# Patient Record
Sex: Male | Born: 1985 | Race: Black or African American | Hispanic: No | Marital: Single | Smoking: Current some day smoker
Health system: Southern US, Community
[De-identification: ages and names within clinical notes are randomized; demographics above are authoritative.]

## PROBLEM LIST (undated history)

## (undated) HISTORY — PX: FOOT SURGERY: SHX648

---

## 2014-03-27 ENCOUNTER — Encounter (HOSPITAL_BASED_OUTPATIENT_CLINIC_OR_DEPARTMENT_OTHER): Payer: Self-pay | Admitting: Emergency Medicine

## 2014-03-27 ENCOUNTER — Emergency Department (HOSPITAL_BASED_OUTPATIENT_CLINIC_OR_DEPARTMENT_OTHER)
Admission: EM | Admit: 2014-03-27 | Discharge: 2014-03-27 | Disposition: A | Discharge status: Home / Self Care | Payer: Self-pay | Attending: Emergency Medicine | Admitting: Emergency Medicine

## 2014-03-27 DIAGNOSIS — Z720 Tobacco use: Secondary | ICD-10-CM | POA: Insufficient documentation

## 2014-03-27 DIAGNOSIS — K029 Dental caries, unspecified: Secondary | ICD-10-CM | POA: Insufficient documentation

## 2014-03-27 DIAGNOSIS — K006 Disturbances in tooth eruption: Secondary | ICD-10-CM | POA: Insufficient documentation

## 2014-03-27 MED ORDER — OXYCODONE-ACETAMINOPHEN 5-325 MG PO TABS
1.0000 | ORAL_TABLET | Freq: Once | ORAL | Status: AC
  Administered 2014-03-27: 1 via ORAL
  Filled 2014-03-27: qty 1

## 2014-03-27 MED ORDER — HYDROCODONE-ACETAMINOPHEN 7.5-325 MG/15ML PO SOLN: 10.0000 mL | Freq: Four times a day (QID) | ORAL | Status: AC | PRN

## 2014-03-27 MED ORDER — PENICILLIN V POTASSIUM 500 MG PO TABS: 500.0000 mg | ORAL_TABLET | Freq: Four times a day (QID) | ORAL | Status: AC

## 2014-03-27 MED ORDER — PENICILLIN V POTASSIUM 250 MG PO TABS
500.0000 mg | ORAL_TABLET | Freq: Once | ORAL | Status: AC
  Administered 2014-03-27: 500 mg via ORAL
  Filled 2014-03-27: qty 2

## 2014-03-27 NOTE — ED Provider Notes (Signed)
CSN: [id number]     Arrival date & time 03/27/14  [id number] History   First MD Initiated Contact with Patient 03/27/14 [phone]     Chief Complaint  Patient presents with  . Dental Pain     (Consider location/radiation/quality/duration/timing/severity/associated sxs/prior Treatment) Patient is a 28 y.o. male presenting with tooth pain. The history is provided by the patient.  Dental Pain Location:  Upper and lower Upper teeth location:  2/RU 2nd molar Lower teeth location:  19/LL 1st molar and 31/RL 2nd molar Quality:  Dull Severity:  Severe Onset quality:  Gradual Timing:  Constant Progression:  Worsening Chronicity:  Chronic Context: dental fracture   Context comment:  New fracture this week Relieved by:  Nothing Worsened by:  Nothing tried Ineffective treatments:  None tried Associated symptoms: no drooling, no facial swelling, no fever, no neck swelling and no trismus   Risk factors: smoking     History reviewed. No pertinent past medical history. History reviewed. No pertinent past surgical history. History reviewed. No pertinent family history. History  Substance Use Topics  . Smoking status: Current Some Day Smoker  . Smokeless tobacco: Not on file  . Alcohol Use: Yes     Comment: occ    Review of Systems  Constitutional: Negative for fever.  HENT: Positive for dental problem. Negative for drooling and facial swelling.   All other systems reviewed and are negative.     Allergies  Review of patient's allergies indicates no known allergies.  Home Medications   Prior to Admission medications   Medication Sig Start Date End Date Taking? Authorizing Provider  HYDROcodone-acetaminophen (HYCET) 7.5-325 mg/15 ml solution Take 10 mLs by mouth every 6 (six) hours as needed for moderate pain or severe pain. 03/27/14   Jamise Pentland K Marzetta Lanza-Rasch, MD  penicillin v potassium (VEETID) 500 MG tablet Take 1 tablet (500 mg total) by mouth 4 (four) times daily. 03/27/14 04/03/14   Erielle Gawronski K Travion Ke-Rasch, MD   BP 131/67 mmHg  Pulse 74  Temp(Src) 98.8 F (37.1 C) (Oral)  Resp 20  Ht 6\' 2"  (1.88 m)  Wt 195 lb (88.451 kg)  BMI 25.03 kg/m2  SpO2 98% Physical Exam  Constitutional: He is oriented to person, place, and time. He appears well-developed and well-nourished. No distress.  HENT:  Head: Normocephalic and atraumatic.  Mouth/Throat: Oropharynx is clear and moist. Mucous membranes are not dry. No trismus in the jaw. Abnormal dentition. Dental caries present. No dental abscesses or uvula swelling. No oropharyngeal exudate.  Eyes: Conjunctivae and EOM are normal.  Neck: Normal range of motion. Neck supple. No tracheal deviation present.  Cardiovascular: Normal rate, regular rhythm and intact distal pulses.   Pulmonary/Chest: Effort normal and breath sounds normal. No respiratory distress. He has no wheezes.  Abdominal: Soft. Bowel sounds are normal.  Musculoskeletal: Normal range of motion.  Lymphadenopathy:    He has no cervical adenopathy.  Neurological: He is alert and oriented to person, place, and time.  Skin: Skin is warm and dry.  Psychiatric: He has a normal mood and affect.    ED Course  Procedures (including critical care time) Labs Review Labs Reviewed - No data to display  Imaging Review No results found.   EKG Interpretation None      MDM   Final diagnoses:  Dental caries   Medications  penicillin v potassium (VEETID) tablet 500 mg (500 mg Oral Given 03/27/14 0410)  oxyCODONE-acetaminophen (PERCOCET/ROXICET) 5-325 MG per tablet 1 tablet (1 tablet Oral Given 03/27/14 0410)  Multiple Ellis 3 fractures with necrosis and poor dentition.  Will treat with PCN and close follow up with dentistry.  Referral and resource guide given.      Jasmine AweApril K Valeri Sula-Rasch, MD 03/27/14 [phone]

## 2014-03-27 NOTE — ED Notes (Signed)
History of dental caries with tooth breaking off. Pt does not have a regular dentist.

## 2014-03-27 NOTE — Discharge Instructions (Signed)
Dental Care and Dentist Visits °Dental care supports good overall health. Regular dental visits can also help you avoid dental pain, bleeding, infection, and other more serious health problems in the future. It is important to keep the mouth healthy because diseases in the teeth, gums, and other oral tissues can spread to other areas of the body. Some problems, such as diabetes, heart disease, and pre-term labor have been associated with poor oral health.  °See your dentist every 6 months. If you experience emergency problems such as a toothache or broken tooth, go to the dentist right away. If you see your dentist regularly, you may catch problems early. It is easier to be treated for problems in the early stages.  °WHAT TO EXPECT AT A DENTIST VISIT  °Your dentist will look for many common oral health problems and recommend proper treatment. At your regular dental visit, you can expect: °· Gentle cleaning of the teeth and gums. This includes scraping and polishing. This helps to remove the sticky substance around the teeth and gums (plaque). Plaque forms in the mouth shortly after eating. Over time, plaque hardens on the teeth as tartar. If tartar is not removed regularly, it can cause problems. Cleaning also helps remove stains. °· Periodic X-rays. These pictures of the teeth and supporting bone will help your dentist assess the health of your teeth. °· Periodic fluoride treatments. Fluoride is a natural mineral shown to help strengthen teeth. Fluoride treatment involves applying a fluoride gel or varnish to the teeth. It is most commonly done in children. °· Examination of the mouth, tongue, jaws, teeth, and gums to look for any oral health problems, such as: °¨ Cavities (dental caries). This is decay on the tooth caused by plaque, sugar, and acid in the mouth. It is best to catch a cavity when it is small. °¨ Inflammation of the gums caused by plaque buildup (gingivitis). °¨ Problems with the mouth or malformed  or misaligned teeth. °¨ Oral cancer or other diseases of the soft tissues or jaws.  °KEEP YOUR TEETH AND GUMS HEALTHY °For healthy teeth and gums, follow these general guidelines as well as your dentist's specific advice: °· Have your teeth professionally cleaned at the dentist every 6 months. °· Brush twice daily with a fluoride toothpaste. °· Floss your teeth daily.  °· Ask your dentist if you need fluoride supplements, treatments, or fluoride toothpaste. °· Eat a healthy diet. Reduce foods and drinks with added sugar. °· Avoid smoking. °TREATMENT FOR ORAL HEALTH PROBLEMS °If you have oral health problems, treatment varies depending on the conditions present in your teeth and gums. °· Your caregiver will most likely recommend good oral hygiene at each visit. °· For cavities, gingivitis, or other oral health disease, your caregiver will perform a procedure to treat the problem. This is typically done at a separate appointment. Sometimes your caregiver will refer you to another dental specialist for specific tooth problems or for surgery. °SEEK IMMEDIATE DENTAL CARE IF: °· You have pain, bleeding, or soreness in the gum, tooth, jaw, or mouth area. °· A permanent tooth becomes loose or separated from the gum socket. °· You experience a blow or injury to the mouth or jaw area. °Document Released: 12/01/2010 Document Revised: 06/13/2011 Document Reviewed: 12/01/2010 °ExitCare® Patient Information ©2015 [employer]. This information is not intended to replace advice given to you by your health care provider. Make sure you discuss any questions you have with your health care provider. ° °Emergency Department Resource Guide °1) Find a Doctor   and Pay Out of Pocket °Although you won't have to find out who is covered by your insurance plan, it is a good idea to ask around and get recommendations. You will then need to call the office and see if the doctor you have chosen will accept you as a new patient and what types of  options they offer for patients who are self-pay. Some doctors offer discounts or will set up payment plans for their patients who do not have insurance, but you will need to ask so you aren't surprised when you get to your appointment. ° °2) Contact Your Local Health Department °Not all health departments have doctors that can see patients for sick visits, but many do, so it is worth a call to see if yours does. If you don't know where your local health department is, you can check in your phone book. The CDC also has a tool to help you locate your state's health department, and many state websites also have listings of all of their local health departments. ° °3) Find a Walk-in Clinic °If your illness is not likely to be very severe or complicated, you may want to try a walk in clinic. These are popping up all over the country in pharmacies, drugstores, and shopping centers. They're usually staffed by nurse practitioners or physician assistants that have been trained to treat common illnesses and complaints. They're usually fairly quick and inexpensive. However, if you have serious medical issues or chronic medical problems, these are probably not your best option. ° °No Primary Care Doctor: °- Call [facility] at  832-8000 - they can help you locate a primary care doctor that  accepts your insurance, provides certain services, etc. °- Physician Referral Service- 1-800-533-3463 ° °Chronic Pain Problems: °Organization         Address  Phone   Notes  °[facility]  (336) 297-2271 Patients need to be referred by their primary care doctor.  ° °Medication Assistance: °Organization         Address  Phone   Notes  °[place] [facility] [address] °[place], [place] [address] (336) 641-8030 --Must be a resident of [place] °-- Must have NO insurance coverage whatsoever (no Medicaid/ Medicare, etc.) °-- The pt. MUST have a primary care doctor that directs  their care regularly and follows them in the community °  °[facility]  (866) 331-1348   °[employer]  (888) 892-1162   ° °Agencies that provide inexpensive medical care: °Organization         Address  Phone   Notes  °[facility]  (336) 832-8035   °[facility]    (336) 832-7272   °[facility] [address] °[place], [place] [address] (336) 832-4777   °[facility] [address], °[place] (336) 271-4999   °[facility]    (336) 373-0678   °[facility]    (336) 272-1050   °[facility] ° [address], [place] Phone:  (336) 832-4444, Fax:  (336) 832-4440 Hours of Operation:  9 am - 6 pm, M-F.  Also accepts Medicaid/Medicare and self-pay.  °[facility] ° [address], [place] Phone: (336) 832-3150, Fax: (336) 832-3151. Hours of Operation:  8:30 am - 5:30 pm, M-F.  Also accepts Medicaid and self-pay.  °[facility] [place] [address], [place] Phone: (336) 878-6027   °  [facility] [address], [place], [place] (336)723-1848, Ext. 123 Mondays & Thursdays: 7-9 AM.  First 15 patients are seen on a first come, first serve basis. °  ° °Medicaid-accepting [place] Providers: ° °Organization         Address  Phone   Notes  °[facility] [address], [place] (336) 641-2100 Also accepts self-pay patients.  °[facility] [address], [place] ° (336) 856-9996   °[facility] [address], [place] (336) 288-8857   °[facility] [address] [place] [address], [place] (336) 299-7000   °Veita Bland [address], [place]  ° (336) 373-1557 Only accepts [place] Access Medicaid patients after they have their name applied to their card.  ° °Self-Pay (no insurance) in [place]: ° °Organization          Address  Phone   Notes  °Sickle Cell Patients, [facility] [address], [place] (336) 832-1970   °[facility] [address], [place] (336) 832-4400   °[facility] ° [address], [place] (336) 992-4800   °[facility]/Dr. Osei-Bonsu ° [address] [place] [address], [place] or [address], [place] (336) 841-8500 Phone number for both [place] and [place] locations is the same.  °[facility] [address], [place] (336) 299-0000   °[facility] [address] [place] [address], [place] or [address] (336) 852-7530 °(336) 878-2260   °[facility] [address], [place] (336) 350-1642, phone; (336) 294-5005, fax Sees patients 1st and 3rd Saturday of every month.  Must not qualify for public or private insurance (i.e. Medicaid, Medicare, [place] Health Choice, Veterans' Benefits) • Household income should be no more than 200% of the poverty level •The clinic cannot treat you if you are pregnant or think you are pregnant • Sexually transmitted diseases are not treated at the clinic.  ° ° °Dental Care: °Organization         Address  Phone  Notes  °[facility] [address], [place] (336) 641-6152 Accepts children up to age 21 who are enrolled in Medicaid or [place] Health Choice; pregnant women with a Medicaid card; and children who have applied for Medicaid or [place] Health Choice, but were declined, whose parents can pay a reduced fee at time of service.  °[facility] [place]  [address], [place] (336) 641-7733 Accepts children up to age 21 who are enrolled in Medicaid or [place] Health Choice; pregnant women with a Medicaid card; and children who have applied for Medicaid or [place] Health Choice, but were declined, whose parents can pay a reduced fee at time of  service.  °[facility] ° [address],  (336) 641-4533 Patients are seen by appointment only. Walk-ins are not accepted. [facility] will see patients 18 years of age and older. °Monday - Tuesday (8am-5pm) °Most Wednesdays (8:30-5pm) °$30 per visit, cash only  °[facility] ° [address], [place] (336) 641-4533 Patients are seen by appointment only. Walk-ins are not accepted. [facility] will see patients 18 years of age and older. °One Wednesday Evening (Monthly: Volunteer Based).  $30 per visit,   cash only  °[facility]  (919) 537-3737 for adults; Children under age 4, call Graduate Pediatric Dentistry at (919) [phone]. Children aged 4-14, please call (919) [phone] to request a pediatric application. ° Dental services are provided in all areas of dental care including fillings, crowns and bridges, complete and partial dentures, implants, gum treatment, root canals, and extractions. Preventive care is also provided. Treatment is provided to both adults and children. °Patients are selected via a lottery and there is often a waiting list. °  °[facility] [address], °[place] ° (336) 763-8833 [email or web address] °  °[facility] [address], [place], [place] (336)723-1848, Ext. 123 Second and Fourth Thursday of each month, opens at 6:30 AM; Clinic ends at 9 AM.  Patients are seen on a first-come first-served basis, and a limited number are seen during each clinic.  ° °[facility] ° [address], [place], [place] (336) 723-7904   Eligibility Requirements °You must have lived in [place], [place], or [place] counties for at least the last three months. °  You cannot be eligible for state or federal sponsored healthcare insurance, including Veterans Administration, Medicaid, or Medicare. °  You generally cannot be eligible for healthcare insurance through your employer.   °  How to apply: °Eligibility screenings are held every Tuesday and Wednesday afternoon from 1:00 pm until 4:00 pm. You do not need an appointment for the interview!  °[facility] [address], [place], [place] 336-631-2330   °[facility]  336-342-8273   °[facility]  336-703-3100   °[facility]  336-570-6415   ° °Behavioral Health Resources in the Community: °Intensive Outpatient Programs °Organization         Address  Phone  Notes  °[facility] [address], [place], [place] 336-878-6098   °[facility] [address], [place], [place] 336-832-9800   °[facility] [address], [place], [place] ° 336-882-2125   °[facility] [address],  °[place], [place] 1-800-853-5163 or 336-641-4981   °Substance Abuse Resources °Organization         Address  Phone  Notes  °[facility]  336-882-2125   °[facility]  336-784-9470   °[facility]  336-285-9073   °[facility]  336-845-3988   °Residential & Outpatient Substance Abuse Program  1-800-659-3381   °Psychological Services °Organization         Address  Phone  Notes  °[facility]  336- 832-9600   °[facility]  336- 378-7881   °[facility] [address], [place] 1-800-853-5163 or 336-641-4981   ° °Mobile Crisis Teams °Organization         Address  Phone  Notes  °[facility], Mobile Crisis Care Unit  1-877-626-1772   °Assertive °[facility] ° [address] [place], [place] 336-834-9664   °Sharon DeEsch [address] °[place] 336-554-5454   ° °Self-Help/Support Groups °Organization         Address  Phone             Notes  °[facility] - variety of support groups  336- 373-1402 Call for more information  °Narcotics Anonymous (NA), [facility] [address], °[place]  2 meetings at this location  ° °Residential Treatment Programs °Organization           Address  Phone  Notes  °[facility] [address],    °[place]  1-866-801-8205   °[facility] ° [address], [place], [place] 704-293-8524   °[facility] [address], [place] 336-845-3988 Admissions: 8am-3pm M-F  °[facility] [address]    °[place], [place] 336-841-1104   °[facility] [address], [place], [place] 336-379-7146   °[facility] [address]  °[place], [place] 336-285-9073   °[facility] Intensive Outpatient [address], [place], [place] 336-852-3033   °[facility] [address]  °[place], [place] 1-877-615-2722 or 336-784-9470   °[facility] [address] [place], [place] 336-227-7417 Accepts Medicaid  °[facility] [address]  °[place] 1-800-659-3381 Substance Abuse/Addiction Treatment  ° °[place] Behavioral Health Resources °Organization         Address  Phone  Notes  °[facility]  (888) 581-9988   °Julie Brannon, PhD [address] [place], [place]   (336) 349-5553 or (336) 951-0000   °[facility]   [address] °[place], [place] (336) 349-4454   °[facility] [address], [place], [place] (336) 342-8316 Insurance/Medicaid/sponsorship through [facility]  °[facility] [address]                                    [place], [place] (336) 342-8316 Therapy/tele-psych/case  °[facility] [address]  ° [place], [place] (336) 349-2233    °Dr. Arfeen  (336) 349-4544   °[facility] [place]  [employer] [place] [facility] 1) [address], [place] °2) [address], [place] °3)  [address], [place] (336) 349-3220 °(336) 342-7768 ° °(336) 342-8140   °[place] [facility]  Hotline (336) 342-1394 or (336) 342-3537 (After Hours)    ° °

## 2014-03-30 ENCOUNTER — Emergency Department (HOSPITAL_BASED_OUTPATIENT_CLINIC_OR_DEPARTMENT_OTHER)
Admission: EM | Admit: 2014-03-30 | Discharge: 2014-03-30 | Disposition: A | Discharge status: Home / Self Care | Payer: Self-pay | Attending: Emergency Medicine | Admitting: Emergency Medicine

## 2014-03-30 ENCOUNTER — Encounter (HOSPITAL_BASED_OUTPATIENT_CLINIC_OR_DEPARTMENT_OTHER): Payer: Self-pay | Admitting: *Deleted

## 2014-03-30 DIAGNOSIS — K088 Other specified disorders of teeth and supporting structures: Secondary | ICD-10-CM | POA: Insufficient documentation

## 2014-03-30 DIAGNOSIS — K029 Dental caries, unspecified: Secondary | ICD-10-CM | POA: Insufficient documentation

## 2014-03-30 DIAGNOSIS — K006 Disturbances in tooth eruption: Secondary | ICD-10-CM | POA: Insufficient documentation

## 2014-03-30 DIAGNOSIS — Z720 Tobacco use: Secondary | ICD-10-CM | POA: Insufficient documentation

## 2014-03-30 DIAGNOSIS — K0381 Cracked tooth: Secondary | ICD-10-CM | POA: Insufficient documentation

## 2014-03-30 DIAGNOSIS — K0889 Other specified disorders of teeth and supporting structures: Secondary | ICD-10-CM

## 2014-03-30 DIAGNOSIS — Z792 Long term (current) use of antibiotics: Secondary | ICD-10-CM | POA: Insufficient documentation

## 2014-03-30 MED ORDER — TRAMADOL HCL 50 MG PO TABS
50.0000 mg | ORAL_TABLET | Freq: Once | ORAL | Status: AC
  Administered 2014-03-30: 50 mg via ORAL
  Filled 2014-03-30: qty 1

## 2014-03-30 MED ORDER — ACETAMINOPHEN 325 MG PO TABS
650.0000 mg | ORAL_TABLET | Freq: Once | ORAL | Status: AC
  Administered 2014-03-30: 650 mg via ORAL
  Filled 2014-03-30: qty 2

## 2014-03-30 MED ORDER — TRAMADOL-ACETAMINOPHEN 37.5-325 MG PO TABS: 1.0000 | ORAL_TABLET | Freq: Four times a day (QID) | ORAL | Status: AC | PRN

## 2014-03-30 NOTE — ED Provider Notes (Signed)
CSN: [id number]     Arrival date & time 03/30/14  1725 History   First MD Initiated Contact with Patient 03/30/14 1749     Chief Complaint  Patient presents with  . Dental Pain     (Consider location/radiation/quality/duration/timing/severity/associated sxs/prior Treatment) Patient is a 28 y.o. male presenting with tooth pain. The history is provided by the patient and medical records.  Dental Pain   This is a 28 year old male with no significant past medical history presenting to the ED for dental pain. Seen 4 days ago for the same, prescribed pain medication and antibiotics. He reports no improvement of his symptoms. He states he has an appointment with dentist tomorrow for first evaluation, and an appointment next week to have teeth extracted. He states the pain medication only lasted for 2 days and was not really helping. He states he discontinued the antibiotics. He denies any fever or chills.  History reviewed. No pertinent past medical history. History reviewed. No pertinent past surgical history. No family history on file. History  Substance Use Topics  . Smoking status: Current Some Day Smoker  . Smokeless tobacco: Not on file  . Alcohol Use: Yes     Comment: occ    Review of Systems  HENT: Positive for dental problem.   All other systems reviewed and are negative.     Allergies  Review of patient's allergies indicates no known allergies.  Home Medications   Prior to Admission medications   Medication Sig Start Date End Date Taking? Authorizing Provider  HYDROcodone-acetaminophen (HYCET) 7.5-325 mg/15 ml solution Take 10 mLs by mouth every 6 (six) hours as needed for moderate pain or severe pain. 03/27/14   April K Palumbo-Rasch, MD  penicillin v potassium (VEETID) 500 MG tablet Take 1 tablet (500 mg total) by mouth 4 (four) times daily. 03/27/14 04/03/14  April K Palumbo-Rasch, MD   BP 151/100 mmHg  Pulse 75  Temp(Src) 99.8 F (37.7 C) (Oral)  Resp 18  Ht 6'  2" (1.88 m)  Wt 195 lb (88.451 kg)  BMI 25.03 kg/m2  SpO2 100%   Physical Exam  Constitutional: He is oriented to person, place, and time. He appears well-developed and well-nourished. No distress.  HENT:  Head: Normocephalic and atraumatic.  Mouth/Throat: Uvula is midline, oropharynx is clear and moist and mucous membranes are normal. Abnormal dentition. Dental caries present. No dental abscesses. No oropharyngeal exudate, posterior oropharyngeal edema, posterior oropharyngeal erythema or tonsillar abscesses.    Teeth largely in poor dentition, teeth painful as depicted- all broken with multiple cavities, surrounding gingiva with apparent gingivitis but no appreciable fluid collection or abscess formation, handling secretions appropriately, no trismus  Eyes: Conjunctivae and EOM are normal. Pupils are equal, round, and reactive to light.  Neck: Normal range of motion. Neck supple.  Cardiovascular: Normal rate, regular rhythm and normal heart sounds.   Pulmonary/Chest: Effort normal and breath sounds normal. No respiratory distress. He has no wheezes.  Musculoskeletal: Normal range of motion.  Neurological: He is alert and oriented to person, place, and time.  Skin: Skin is warm and dry. He is not diaphoretic.  Psychiatric: He has a normal mood and affect.  Nursing note and vitals reviewed.   ED Course  Procedures (including critical care time) Labs Review Labs Reviewed - No data to display  Imaging Review No results found.   EKG Interpretation None      MDM   Final diagnoses:  Pain, dental   28 year old male with dental pain. Seen  in the ED for days ago for the same. He has since run out of pain medication, reports no relief. States has appointment with dentist this week. No signs of abscess on exam, patient afebrile and non-toxic. He'll continue antibiotics from prior visit, short supply Ultracet given. Follow-up with dentist as scheduled.  Discussed plan with patient,  he/she acknowledged understanding and agreed with plan of care.  Return precautions given for new or worsening symptoms.  Garlon HatchetLisa M Lilias Lorensen, PA-C 03/30/14 [id number]  Mirian MoMatthew Gentry, MD 04/05/14 [phone]

## 2014-03-30 NOTE — ED Notes (Addendum)
Patient states that he has continuing dental pain. Was seen on Thursday, but states the medications are not helping. States that he has a dentist appt in a week and a half. Told another RN that he had an appt on Monday. Pt was given hycodan, states it only lasted about 2 days and he has a hard time taking liquid medicine.

## 2014-03-30 NOTE — Discharge Instructions (Signed)
Take the prescribed medication as directed. Follow-up with dentist. Return to the ED for new or worsening symptoms.

## 2015-11-29 ENCOUNTER — Emergency Department (HOSPITAL_BASED_OUTPATIENT_CLINIC_OR_DEPARTMENT_OTHER): Payer: Self-pay

## 2015-11-29 ENCOUNTER — Emergency Department (HOSPITAL_BASED_OUTPATIENT_CLINIC_OR_DEPARTMENT_OTHER)
Admission: EM | Admit: 2015-11-29 | Discharge: 2015-11-29 | Disposition: A | Discharge status: Home / Self Care | Payer: Self-pay | Attending: Emergency Medicine | Admitting: Emergency Medicine

## 2015-11-29 ENCOUNTER — Encounter (HOSPITAL_BASED_OUTPATIENT_CLINIC_OR_DEPARTMENT_OTHER): Payer: Self-pay | Admitting: Emergency Medicine

## 2015-11-29 DIAGNOSIS — Y9389 Activity, other specified: Secondary | ICD-10-CM | POA: Insufficient documentation

## 2015-11-29 DIAGNOSIS — S9032XA Contusion of left foot, initial encounter: Secondary | ICD-10-CM

## 2015-11-29 DIAGNOSIS — Y999 Unspecified external cause status: Secondary | ICD-10-CM | POA: Insufficient documentation

## 2015-11-29 DIAGNOSIS — F17200 Nicotine dependence, unspecified, uncomplicated: Secondary | ICD-10-CM | POA: Insufficient documentation

## 2015-11-29 DIAGNOSIS — Y92410 Unspecified street and highway as the place of occurrence of the external cause: Secondary | ICD-10-CM | POA: Insufficient documentation

## 2015-11-29 MED ORDER — IBUPROFEN 200 MG PO TABS
600.0000 mg | ORAL_TABLET | Freq: Once | ORAL | Status: AC
  Administered 2015-11-29: 600 mg via ORAL
  Filled 2015-11-29: qty 1
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
## 2015-11-29 NOTE — ED Triage Notes (Signed)
Patient ambulatory to triage, in no distress - asked to turn off face time with mother during the triage process.
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
## 2017-12-01 ENCOUNTER — Encounter (HOSPITAL_BASED_OUTPATIENT_CLINIC_OR_DEPARTMENT_OTHER): Payer: Self-pay

## 2017-12-01 ENCOUNTER — Emergency Department (HOSPITAL_BASED_OUTPATIENT_CLINIC_OR_DEPARTMENT_OTHER)
Admission: EM | Admit: 2017-12-01 | Discharge: 2017-12-01 | Disposition: A | Discharge status: Home / Self Care | Payer: Self-pay | Attending: Emergency Medicine | Admitting: Emergency Medicine

## 2017-12-01 ENCOUNTER — Other Ambulatory Visit: Payer: Self-pay

## 2017-12-01 DIAGNOSIS — F17200 Nicotine dependence, unspecified, uncomplicated: Secondary | ICD-10-CM | POA: Insufficient documentation

## 2017-12-01 DIAGNOSIS — Z202 Contact with and (suspected) exposure to infections with a predominantly sexual mode of transmission: Secondary | ICD-10-CM | POA: Insufficient documentation

## 2017-12-01 LAB — URINALYSIS, ROUTINE W REFLEX MICROSCOPIC
Bilirubin Urine: NEGATIVE
GLUCOSE, UA: NEGATIVE mg/dL
HGB URINE DIPSTICK: NEGATIVE
KETONES UR: NEGATIVE mg/dL
Leukocytes, UA: NEGATIVE
Nitrite: NEGATIVE
Protein, ur: NEGATIVE mg/dL
Specific Gravity, Urine: 1.025 (ref 1.005–1.030)
pH: 6 (ref 5.0–8.0)

## 2017-12-01 MED ORDER — AZITHROMYCIN 250 MG PO TABS
1000.0000 mg | ORAL_TABLET | Freq: Once | ORAL | Status: AC
  Administered 2017-12-01: 1000 mg via ORAL
  Filled 2017-12-01: qty 4

## 2017-12-01 MED ORDER — LIDOCAINE HCL (PF) 1 % IJ SOLN
INTRAMUSCULAR | Status: AC
  Administered 2017-12-01: 1.2 mL
  Filled 2017-12-01: qty 5

## 2017-12-01 MED ORDER — CEFTRIAXONE SODIUM 250 MG IJ SOLR
250.0000 mg | Freq: Once | INTRAMUSCULAR | Status: AC
  Administered 2017-12-01: 250 mg via INTRAMUSCULAR
  Filled 2017-12-01: qty 250

## 2017-12-01 NOTE — ED Triage Notes (Signed)
Pt reports he came to get tested for an STD that his girlfriend told him she was positive for.

## 2017-12-01 NOTE — ED Provider Notes (Signed)
[facility] EMERGENCY DEPARTMENT Provider Note   CSN: [id number] Arrival date & time: 12/01/17  0840     History   Chief Complaint Chief Complaint  Patient presents with  . Exposure to STD    HPI Derek Bradley is a 32 y.o. male.  HPI Patient presents after an STD exposure.  States his girlfriend tested positive for chlamydia.  He has had unprotected sex with her.  Patient states he really has not had any symptoms but states once she told him he states he feels a little strange after urinates.  States he would not think anything of this if he had not just been told about the chlamydia.  No penile discharge.  No fevers.  No rashes. History reviewed. No pertinent past medical history.  There are no active problems to display for this patient.   Past Surgical History:  Procedure Laterality Date  . FOOT SURGERY          Home Medications    Prior to Admission medications   Medication Sig Start Date End Date Taking? Authorizing Provider  HYDROcodone-acetaminophen (HYCET) 7.5-325 mg/15 ml solution Take 10 mLs by mouth every 6 (six) hours as needed for moderate pain or severe pain. 03/27/14   Palumbo, April, MD  traMADol-acetaminophen (ULTRACET) 37.5-325 MG per tablet Take 1 tablet by mouth every 6 (six) hours as needed. 03/30/14   Garlon Hatchet, PA-C    Family History No family history on file.  Social History Social History   Tobacco Use  . Smoking status: Current Some Day Smoker  . Smokeless tobacco: Never Used  Substance Use Topics  . Alcohol use: Yes    Comment: occ  . Drug use: Not on file     Allergies   Patient has no known allergies.   Review of Systems Review of Systems  Constitutional: Negative for appetite change and fever.  Respiratory: Negative for shortness of breath.   Cardiovascular: Negative for chest pain.  Genitourinary: Negative for discharge, dysuria, penile pain, penile swelling and testicular pain.  Musculoskeletal:  Negative for back pain.  Skin: Negative for rash.  Neurological: Negative for weakness.     Physical Exam Updated Vital Signs BP (!) 163/81 (BP Location: Left Arm)   Pulse 68   Temp 98.2 F (36.8 C) (Oral)   Resp 18   Ht 6\' 2"  (1.88 m)   Wt 86.2 kg   SpO2 100%   BMI 24.39 kg/m   Physical Exam  Constitutional: He appears well-developed.  HENT:  Head: Atraumatic.  Cardiovascular: Normal rate.  Genitourinary: Penis normal. No penile tenderness.  Genitourinary Comments: No penile discharge.  No testicular tenderness.  No skin lesions.  Neurological: He is alert.  Skin: Skin is warm.     ED Treatments / Results  Labs (all labs ordered are listed, but only abnormal results are displayed) Labs Reviewed  RPR  HIV ANTIBODY (ROUTINE TESTING)  URINALYSIS, ROUTINE W REFLEX MICROSCOPIC  GC/CHLAMYDIA PROBE AMP ([place]) NOT AT [facility]    EKG None  Radiology No results found.  Procedures Procedures (including critical care time)  Medications Ordered in ED Medications  cefTRIAXone (ROCEPHIN) injection 250 mg (has no administration in time range)  azithromycin (ZITHROMAX) tablet 1,000 mg (has no administration in time range)     Initial Impression / Assessment and Plan / ED Course  I have reviewed the triage vital signs and the nursing notes.  Pertinent labs & imaging results that were available during my care of  the patient were reviewed by me and considered in my medical decision making (see chart for details).     Patient with STD exposure.  Will empirically treat.  Testing sent.  Discharge home.  Final Clinical Impressions(s) / ED Diagnoses   Final diagnoses:  STD exposure    ED Discharge Orders    None       Benjiman CorePickering, Anneliese Leblond, MD 12/01/17 [phone]

## 2017-12-02 LAB — RPR: RPR Ser Ql: NONREACTIVE

## 2017-12-02 LAB — HIV ANTIBODY (ROUTINE TESTING W REFLEX): HIV Screen 4th Generation wRfx: NONREACTIVE

## 2017-12-05 LAB — GC/CHLAMYDIA PROBE AMP (~~LOC~~) NOT AT ARMC
Chlamydia: POSITIVE — AB
Neisseria Gonorrhea: NEGATIVE

## 2020-06-24 ENCOUNTER — Emergency Department (HOSPITAL_BASED_OUTPATIENT_CLINIC_OR_DEPARTMENT_OTHER)
Admission: EM | Admit: 2020-06-24 | Discharge: 2020-06-25 | Disposition: A | Discharge status: Home / Self Care | Payer: Self-pay | Attending: Emergency Medicine | Admitting: Emergency Medicine

## 2020-06-24 ENCOUNTER — Other Ambulatory Visit: Payer: Self-pay

## 2020-06-24 DIAGNOSIS — R309 Painful micturition, unspecified: Secondary | ICD-10-CM | POA: Insufficient documentation

## 2020-06-24 DIAGNOSIS — R369 Urethral discharge, unspecified: Secondary | ICD-10-CM | POA: Insufficient documentation

## 2020-06-24 DIAGNOSIS — R300 Dysuria: Secondary | ICD-10-CM | POA: Insufficient documentation

## 2020-06-24 DIAGNOSIS — Z202 Contact with and (suspected) exposure to infections with a predominantly sexual mode of transmission: Secondary | ICD-10-CM | POA: Insufficient documentation

## 2020-06-24 DIAGNOSIS — F17200 Nicotine dependence, unspecified, uncomplicated: Secondary | ICD-10-CM | POA: Insufficient documentation

## 2020-06-24 NOTE — ED Triage Notes (Signed)
Per pt had sex with an partner from his past and noticed sensation with urination about 2  weeks ago has discharge and pain now.

## 2020-06-25 ENCOUNTER — Encounter (HOSPITAL_BASED_OUTPATIENT_CLINIC_OR_DEPARTMENT_OTHER): Payer: Self-pay | Admitting: Emergency Medicine

## 2020-06-25 LAB — HIV ANTIBODY (ROUTINE TESTING W REFLEX): HIV Screen 4th Generation wRfx: NONREACTIVE

## 2020-06-25 LAB — RPR: RPR Ser Ql: NONREACTIVE

## 2020-06-25 MED ORDER — DOXYCYCLINE HYCLATE 100 MG PO CAPS: 100.0000 mg | ORAL_CAPSULE | Freq: Two times a day (BID) | ORAL | 0 refills | Status: AC

## 2020-06-25 MED ORDER — DOXYCYCLINE HYCLATE 100 MG PO TABS
100.0000 mg | ORAL_TABLET | Freq: Once | ORAL | Status: AC
  Administered 2020-06-25: 100 mg via ORAL
  Filled 2020-06-25: qty 1

## 2020-06-25 MED ORDER — LIDOCAINE HCL (PF) 1 % IJ SOLN: 2.0000 mL | Freq: Once | INTRAMUSCULAR | Status: AC

## 2020-06-25 MED ORDER — CEFTRIAXONE SODIUM 500 MG IJ SOLR
500.0000 mg | Freq: Once | INTRAMUSCULAR | Status: AC
  Administered 2020-06-25: 500 mg via INTRAMUSCULAR
  Filled 2020-06-25: qty 500

## 2020-06-25 MED ORDER — LIDOCAINE HCL (PF) 1 % IJ SOLN
INTRAMUSCULAR | Status: AC
  Administered 2020-06-25: 2 mL
  Filled 2020-06-25: qty 5

## 2020-06-25 NOTE — Discharge Instructions (Signed)
You have been seen today in the Emergency Department (ED) for pelvic pain and discharge.  You have been treated with a one time dose of antibiotics but will also need to take 1 week of antibiotics.  Please have your partner tested for STD's and do not resume sexual activity until you and your partner have confirmed negative test results or have both been treated.  Please follow up with your doctor as soon as possible regarding today's ED visit and your symptoms.   Return to the ED if your pain worsens, you develop a fever, or for any other symptoms that concern you.

## 2020-06-25 NOTE — ED Provider Notes (Signed)
Emergency Department Provider Note   I have reviewed the triage vital signs and the nursing notes.   HISTORY  Chief Complaint Exposure to STD   HPI Derek Bradley is a 35 y.o. male presents to the emergency department for evaluation of urethral discharge and dysuria.  Patient states he had unprotected sex with a partner and shortly afterwards developed some mild burning with urination.  The symptoms have progressed and is now seeing a cannot discharge in his underwear.  He notes similar symptoms with sexually transmitted infections in the past.  He was last treated in 2019 and had negative RPR and HIV testing at that time. No fever, chills, back pain, or abdominal pain.   History reviewed. No pertinent past medical history.  There are no problems to display for this patient.   Past Surgical History:  Procedure Laterality Date  . FOOT SURGERY      Allergies Patient has no known allergies.  History reviewed. No pertinent family history.  Social History Social History   Tobacco Use  . Smoking status: Current Some Day Smoker  . Smokeless tobacco: Never Used  Substance Use Topics  . Alcohol use: Yes    Comment: occ    Review of Systems  Constitutional: No fever/chills Gastrointestinal: No abdominal pain.  No nausea, no vomiting.  No diarrhea.  No constipation. Genitourinary: Positive dysuria and urethral discharge.  Musculoskeletal: Negative for back pain.   ____________________________________________   PHYSICAL EXAM:  VITAL SIGNS: ED Triage Vitals  Enc Vitals Group     BP 06/25/20 0003 131/77     Pulse Rate 06/25/20 0003 72     Resp 06/25/20 0003 18     Temp 06/25/20 0003 98 F (36.7 C)     Temp Source 06/25/20 0003 Oral     SpO2 06/25/20 0003 98 %     Weight 06/25/20 0000 194 lb (88 kg)     Height 06/25/20 0000 6\' 1"  (1.854 m)   Constitutional: Alert and oriented. Well appearing and in no acute distress. Eyes: Conjunctivae are normal. Head:  Atraumatic. Nose: No congestion/rhinnorhea. Mouth/Throat: Mucous membranes are moist.   Neck: No stridor.   Cardiovascular: Good peripheral circulation.  Respiratory: Normal respiratory effort.  Gastrointestinal: No distention.  Musculoskeletal: No gross deformities of extremities. Neurologic:  Normal speech and language.  Skin:  Skin is warm, dry and intact. No rash noted.  ____________________________________________   LABS (all labs ordered are listed, but only abnormal results are displayed)  Labs Reviewed  RPR  HIV ANTIBODY (ROUTINE TESTING W REFLEX)  GC/CHLAMYDIA PROBE AMP ([place]) NOT AT [facility]   ____________________________________________   PROCEDURES  Procedure(s) performed:   Procedures  None  ____________________________________________   INITIAL IMPRESSION / ASSESSMENT AND PLAN / ED COURSE  Pertinent labs & imaging results that were available during my care of the patient were reviewed by me and considered in my medical decision making (see chart for details).   Patient presents to the emergency department with symptoms consistent with sexually transmitted infection.  Vital signs are unremarkable.  Patient is well-appearing.  Discussed barrier contraception/abstinence to prevent further transmission of sexually transmitted infection prior to completing his antibiotic course.  We discussed the new CDC recommendations regarding treatment of STD including 1 week of doxycycline which was ordered.  Patient last tested for HIV in 2019 and agrees to repeat testing here for HIV and RPR.  He plans to notify his partner that they should also be tested and treated for infection.  Provided contact information for PCP at discharge for further follow up.    ____________________________________________  FINAL CLINICAL IMPRESSION(S) / ED DIAGNOSES  Final diagnoses:  Penile discharge     MEDICATIONS GIVEN DURING THIS VISIT:  Medications  cefTRIAXone (ROCEPHIN)  injection 500 mg (500 mg Intramuscular Given 06/25/20 0039)  doxycycline (VIBRA-TABS) tablet 100 mg (100 mg Oral Given 06/25/20 0038)  lidocaine (PF) (XYLOCAINE) 1 % injection 2 mL (2 mLs Infiltration Given 06/25/20 0050)     NEW OUTPATIENT MEDICATIONS STARTED DURING THIS VISIT:  Discharge Medication List as of 06/25/2020 12:15 AM    START taking these medications   Details  doxycycline (VIBRAMYCIN) 100 MG capsule Take 1 capsule (100 mg total) by mouth 2 (two) times daily for 7 days., Starting Thu 06/25/2020, Until Thu 07/02/2020, Normal        Note:  This document was prepared using Dragon voice recognition software and may include unintentional dictation errors.  Alona Bene, MD, [facility] Emergency Medicine    Brandt Chaney, Arlyss Repress, MD 06/25/20 [phone]

## 2020-06-26 LAB — GC/CHLAMYDIA PROBE AMP (~~LOC~~) NOT AT ARMC
Chlamydia: NEGATIVE
Comment: NEGATIVE
Comment: NORMAL
Neisseria Gonorrhea: NEGATIVE

## 2022-12-22 ENCOUNTER — Encounter (HOSPITAL_BASED_OUTPATIENT_CLINIC_OR_DEPARTMENT_OTHER): Payer: Self-pay | Admitting: Emergency Medicine

## 2022-12-22 ENCOUNTER — Other Ambulatory Visit: Payer: Self-pay

## 2022-12-22 ENCOUNTER — Emergency Department (HOSPITAL_BASED_OUTPATIENT_CLINIC_OR_DEPARTMENT_OTHER)
Admission: EM | Admit: 2022-12-22 | Discharge: 2022-12-22 | Disposition: A | Discharge status: Home / Self Care | Payer: Self-pay | Attending: Emergency Medicine | Admitting: Emergency Medicine

## 2022-12-22 DIAGNOSIS — N4889 Other specified disorders of penis: Secondary | ICD-10-CM | POA: Insufficient documentation

## 2022-12-22 LAB — URINALYSIS, ROUTINE W REFLEX MICROSCOPIC
Bilirubin Urine: NEGATIVE
Glucose, UA: NEGATIVE mg/dL
Hgb urine dipstick: NEGATIVE
Ketones, ur: NEGATIVE mg/dL
Leukocytes,Ua: NEGATIVE
Nitrite: NEGATIVE
Protein, ur: NEGATIVE mg/dL
Specific Gravity, Urine: >=1.03 (ref 1.005–1.030)
pH: 5.5 (ref 5.0–8.0)

## 2022-12-22 MED ORDER — METRONIDAZOLE 500 MG PO TABS
2000.0000 mg | ORAL_TABLET | Freq: Once | ORAL | Status: AC
  Administered 2022-12-22: 2000 mg via ORAL
  Filled 2022-12-22: qty 4

## 2022-12-22 MED ORDER — CEFTRIAXONE SODIUM 500 MG IJ SOLR
500.0000 mg | Freq: Once | INTRAMUSCULAR | Status: AC
  Administered 2022-12-22: 500 mg via INTRAMUSCULAR
  Filled 2022-12-22: qty 500

## 2022-12-22 MED ORDER — AZITHROMYCIN 250 MG PO TABS
1000.0000 mg | ORAL_TABLET | Freq: Once | ORAL | Status: AC
  Administered 2022-12-22: 1000 mg via ORAL
  Filled 2022-12-22: qty 4

## 2022-12-22 MED ORDER — STERILE WATER FOR INJECTION IJ SOLN
INTRAMUSCULAR | Status: AC
  Administered 2022-12-22: 1 mL
  Filled 2022-12-22: qty 10

## 2022-12-22 NOTE — ED Provider Notes (Signed)
[place] EMERGENCY DEPARTMENT AT [facility] Provider Note   CSN: [id number] Arrival date & time: 12/22/22  1916     History  Chief Complaint  Patient presents with   STI Check    Derek Bradley is a 37 y.o. male.  37 yo M with a cc of penile pain.  Going on for the past week.  Patient had new sexual contacts and he thinks shortly thereafter developed symptoms.  He is developed some sort of rash versus discharge underneath his foreskin.  He does not have any right now.  Denies rash otherwise denies pain to the testicles.  Has some burning to the penis.        Home Medications Prior to Admission medications   Medication Sig Start Date End Date Taking? Authorizing Provider  HYDROcodone-acetaminophen (HYCET) 7.5-325 mg/15 ml solution Take 10 mLs by mouth every 6 (six) hours as needed for moderate pain or severe pain. 03/27/14   Palumbo, April, MD  traMADol-acetaminophen (ULTRACET) 37.5-325 MG per tablet Take 1 tablet by mouth every 6 (six) hours as needed. 03/30/14   Garlon Hatchet, PA-C      Allergies    Patient has no known allergies.    Review of Systems   Review of Systems  Physical Exam Updated Vital Signs BP (!) 152/91   Pulse 77   Temp 98.2 F (36.8 C) (Oral)   Resp 18   Ht 6\' 2"  (1.88 m)   Wt 86.2 kg   SpO2 98%   BMI 24.39 kg/m  Physical Exam Vitals and nursing note reviewed.  Constitutional:      Appearance: He is well-developed.  HENT:     Head: Normocephalic and atraumatic.  Eyes:     Pupils: Pupils are equal, round, and reactive to light.  Neck:     Vascular: No JVD.  Cardiovascular:     Rate and Rhythm: Normal rate and regular rhythm.     Heart sounds: No murmur heard.    No friction rub. No gallop.  Pulmonary:     Effort: No respiratory distress.     Breath sounds: No wheezing.  Abdominal:     General: There is no distension.     Tenderness: There is no abdominal tenderness. There is no guarding or rebound.   Genitourinary:    Comments: Uncircumcised.  No obvious rash or penile discharge.  No testicular pain or masses.  No inguinal lymphadenopathy. Musculoskeletal:        General: Normal range of motion.     Cervical back: Normal range of motion and neck supple.  Skin:    Coloration: Skin is not pale.     Findings: No rash.  Neurological:     Mental Status: He is alert and oriented to person, place, and time.  Psychiatric:        Behavior: Behavior normal.     ED Results / Procedures / Treatments   Labs (all labs ordered are listed, but only abnormal results are displayed) Labs Reviewed  URINALYSIS, ROUTINE W REFLEX MICROSCOPIC  RPR  HIV ANTIBODY (ROUTINE TESTING W REFLEX)  GC/CHLAMYDIA PROBE AMP ([place]) NOT AT [facility]    EKG None  Radiology No results found.  Procedures Procedures    Medications Ordered in ED Medications  cefTRIAXone (ROCEPHIN) injection 500 mg (has no administration in time range)  azithromycin (ZITHROMAX) tablet 1,000 mg (has no administration in time range)  metroNIDAZOLE (FLAGYL) tablet 2,000 mg (has no administration in time range)  ED Course/ Medical Decision Making/ A&P                                 Medical Decision Making Amount and/or Complexity of Data Reviewed Labs: ordered.  Risk Prescription drug management.   37 yo M with a chief complaints of concern for sexually transmitted disease.  The patient said that he had a new sexual partner in the beginning of the week and since then has had penile burning and discharge.  Will presumptively treat for gonorrhea and chlamydia.  HIV and syphilis sent off.  PCP or health department follow-up.  10:37 PM:  I have discussed the diagnosis/risks/treatment options with the patient.  Evaluation and diagnostic testing in the emergency department does not suggest an emergent condition requiring admission or immediate intervention beyond what has been performed at this time.  They will follow up  with PCP. We also discussed returning to the ED immediately if new or worsening sx occur. We discussed the sx which are most concerning (e.g., sudden worsening pain, fever, inability to tolerate by mouth) that necessitate immediate return. Medications administered to the patient during their visit and any new prescriptions provided to the patient are listed below.  Medications given during this visit Medications  cefTRIAXone (ROCEPHIN) injection 500 mg (has no administration in time range)  azithromycin (ZITHROMAX) tablet 1,000 mg (has no administration in time range)  metroNIDAZOLE (FLAGYL) tablet 2,000 mg (has no administration in time range)     The patient appears reasonably screen and/or stabilized for discharge and I doubt any other medical condition or other [facility] requiring further screening, evaluation, or treatment in the ED at this time prior to discharge.          Final Clinical Impression(s) / ED Diagnoses Final diagnoses:  Penile pain    Rx / DC Orders ED Discharge Orders     None         Melene Plan, DO 12/22/22 2237

## 2022-12-22 NOTE — ED Triage Notes (Signed)
Pt c/o penile irritation and edema; denies discharge; recent unprotected sex

## 2022-12-22 NOTE — Discharge Instructions (Addendum)
Follow up with your doctor in the office.

## 2022-12-23 LAB — RPR: RPR Ser Ql: NONREACTIVE

## 2022-12-23 LAB — GC/CHLAMYDIA PROBE AMP (~~LOC~~) NOT AT ARMC
Chlamydia: NEGATIVE
Comment: NEGATIVE
Comment: NORMAL
Neisseria Gonorrhea: NEGATIVE

## 2022-12-23 LAB — HIV ANTIBODY (ROUTINE TESTING W REFLEX): HIV Screen 4th Generation wRfx: NONREACTIVE

## 2023-08-16 ENCOUNTER — Other Ambulatory Visit: Payer: Self-pay

## 2023-08-16 ENCOUNTER — Emergency Department (HOSPITAL_BASED_OUTPATIENT_CLINIC_OR_DEPARTMENT_OTHER)
Admission: EM | Admit: 2023-08-16 | Discharge: 2023-08-16 | Disposition: A | Discharge status: Home / Self Care | Attending: Emergency Medicine | Admitting: Emergency Medicine

## 2023-08-16 ENCOUNTER — Encounter (HOSPITAL_BASED_OUTPATIENT_CLINIC_OR_DEPARTMENT_OTHER): Payer: Self-pay

## 2023-08-16 ENCOUNTER — Emergency Department (HOSPITAL_BASED_OUTPATIENT_CLINIC_OR_DEPARTMENT_OTHER)

## 2023-08-16 ENCOUNTER — Other Ambulatory Visit (HOSPITAL_BASED_OUTPATIENT_CLINIC_OR_DEPARTMENT_OTHER): Payer: Self-pay

## 2023-08-16 DIAGNOSIS — M545 Low back pain, unspecified: Secondary | ICD-10-CM | POA: Insufficient documentation

## 2023-08-16 DIAGNOSIS — Y92410 Unspecified street and highway as the place of occurrence of the external cause: Secondary | ICD-10-CM | POA: Diagnosis not present

## 2023-08-16 DIAGNOSIS — M79672 Pain in left foot: Secondary | ICD-10-CM | POA: Diagnosis not present

## 2023-08-16 DIAGNOSIS — M25561 Pain in right knee: Secondary | ICD-10-CM | POA: Diagnosis not present

## 2023-08-16 MED ORDER — IBUPROFEN 600 MG PO TABS
600.0000 mg | ORAL_TABLET | Freq: Four times a day (QID) | ORAL | 0 refills | Status: AC | PRN
Start: 2023-08-16 — End: ?
  Filled 2023-08-16: qty 30, 8d supply, fill #0

## 2023-08-16 MED ORDER — CYCLOBENZAPRINE HCL 10 MG PO TABS
10.0000 mg | ORAL_TABLET | Freq: Two times a day (BID) | ORAL | 0 refills | Status: AC | PRN
Start: 2023-08-16 — End: ?
  Filled 2023-08-16: qty 20, 10d supply, fill #0
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
## 2023-08-16 NOTE — Discharge Instructions (Addendum)
 Fortunately x-ray of your right knee and your left foot did not show any broken bone or dislocation.  Please take medication prescribed as needed for symptom control.  You may also follow-up with orthopedic specialist as needed.

## 2023-08-29 ENCOUNTER — Other Ambulatory Visit (HOSPITAL_BASED_OUTPATIENT_CLINIC_OR_DEPARTMENT_OTHER): Payer: Self-pay
